# Patient Record
Sex: Female | Born: 1990 | Race: White | Hispanic: No | Marital: Single | Smoking: Never smoker
Health system: Southern US, Community
[De-identification: ages and names within clinical notes are randomized; demographics above are authoritative.]

## PROBLEM LIST (undated history)

## (undated) HISTORY — PX: WISDOM TOOTH EXTRACTION: SHX21

---
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
## 2015-06-14 ENCOUNTER — Other Ambulatory Visit: Payer: Self-pay | Admitting: Otolaryngology

## 2015-06-14 DIAGNOSIS — R221 Localized swelling, mass and lump, neck: Secondary | ICD-10-CM

## 2015-06-18 ENCOUNTER — Ambulatory Visit
Admission: RE | Admit: 2015-06-18 | Discharge: 2015-06-18 | Disposition: A | Discharge status: Home / Self Care | Source: Ambulatory Visit | Attending: Otolaryngology | Admitting: Otolaryngology

## 2015-06-18 DIAGNOSIS — R221 Localized swelling, mass and lump, neck: Secondary | ICD-10-CM | POA: Diagnosis present

## 2015-06-18 MED ORDER — IOHEXOL 300 MG/ML  SOLN
75.0000 mL | Freq: Once | INTRAMUSCULAR | Status: AC | PRN
  Administered 2015-06-18: 75 mL via INTRAVENOUS

## 2015-08-04 ENCOUNTER — Encounter: Payer: Self-pay | Admitting: *Deleted

## 2015-08-04 ENCOUNTER — Other Ambulatory Visit

## 2015-08-04 NOTE — Patient Instructions (Signed)
  Your procedure is scheduled on: 08/09/15 Report to Day Surgery. To find out your arrival time please call [phone] between 1PM - 3PM on 08/06/15.  Remember: Instructions that are not followed completely may result in serious medical risk, up to and including death, or upon the discretion of your surgeon and anesthesiologist your surgery may need to be rescheduled.    _x___ 1. Do not eat food or drink liquids after midnight. No gum chewing or hard candies.     __x__ 2. No Alcohol for 24 hours before or after surgery.   ____ 3. Bring all medications with you on the day of surgery if instructed.    __x__ 4. Notify your doctor if there is any change in your medical condition     (cold, fever, infections).     Do not wear jewelry, make-up, hairpins, clips or nail polish.  Do not wear lotions, powders, or perfumes. You may wear deodorant.  Do not shave 48 hours prior to surgery. Men may shave face and neck.  Do not bring valuables to the hospital.    [facility] is not responsible for any belongings or valuables.               Contacts, dentures or bridgework may not be worn into surgery.  Leave your suitcase in the car. After surgery it may be brought to your room.  For patients admitted to the hospital, discharge time is determined by your                treatment team.   Patients discharged the day of surgery will not be allowed to drive home.   Please read over the following fact sheets that you were given:   Surgical Site Infection Prevention   ____ Take these medicines the morning of surgery with A SIP OF WATER:    1. none  2.   3.   4.  5.  6.  ____ Fleet Enema (as directed)   ____ Use CHG Soap as directed  ____ Use inhalers on the day of surgery  ____ Stop metformin 2 days prior to surgery    ____ Take 1/2 of usual insulin dose the night before surgery and none on the morning of surgery.   ____ Stop Coumadin/Plavix/aspirin on ____ Stop Anti-inflammatories  on   ____ Stop supplements until after surgery.    ____ Bring C-Pap to the hospital.

## 2015-08-09 ENCOUNTER — Ambulatory Visit: Admitting: Anesthesiology

## 2015-08-09 ENCOUNTER — Ambulatory Visit
Admission: RE | Admit: 2015-08-09 | Discharge: 2015-08-09 | Disposition: A | Discharge status: Home / Self Care | Source: Ambulatory Visit | Attending: Otolaryngology | Admitting: Otolaryngology

## 2015-08-09 ENCOUNTER — Encounter
Admission: RE | Disposition: A | Discharge status: Home / Self Care | Payer: Self-pay | Source: Ambulatory Visit | Attending: Otolaryngology

## 2015-08-09 DIAGNOSIS — D170 Benign lipomatous neoplasm of skin and subcutaneous tissue of head, face and neck: Secondary | ICD-10-CM | POA: Diagnosis present

## 2015-08-09 DIAGNOSIS — Z7984 Long term (current) use of oral hypoglycemic drugs: Secondary | ICD-10-CM | POA: Insufficient documentation

## 2015-08-09 DIAGNOSIS — Z820 Family history of epilepsy and other diseases of the nervous system: Secondary | ICD-10-CM | POA: Diagnosis not present

## 2015-08-09 DIAGNOSIS — L709 Acne, unspecified: Secondary | ICD-10-CM | POA: Insufficient documentation

## 2015-08-09 DIAGNOSIS — L309 Dermatitis, unspecified: Secondary | ICD-10-CM | POA: Diagnosis not present

## 2015-08-09 HISTORY — PX: LIPOMA EXCISION: SHX5283

## 2015-08-09 LAB — POCT PREGNANCY, URINE: PREG TEST UR: NEGATIVE

## 2015-08-09 SURGERY — EXCISION LIPOMA
Anesthesia: General

## 2015-08-09 MED ORDER — FAMOTIDINE 20 MG PO TABS: 20.0000 mg | ORAL_TABLET | Freq: Once | ORAL | Status: DC

## 2015-08-09 MED ORDER — ONDANSETRON HCL 4 MG/2ML IJ SOLN
INTRAMUSCULAR | Status: DC | PRN
  Administered 2015-08-09: 4 mg via INTRAVENOUS

## 2015-08-09 MED ORDER — MIDAZOLAM HCL 2 MG/2ML IJ SOLN
INTRAMUSCULAR | Status: DC | PRN
  Administered 2015-08-09: 2 mg via INTRAVENOUS

## 2015-08-09 MED ORDER — HYDROCODONE-ACETAMINOPHEN 5-325 MG PO TABS
ORAL_TABLET | ORAL | Status: AC
  Filled 2015-08-09: qty 2

## 2015-08-09 MED ORDER — ONDANSETRON HCL 4 MG/2ML IJ SOLN: 4.0000 mg | Freq: Once | INTRAMUSCULAR | Status: DC | PRN

## 2015-08-09 MED ORDER — FENTANYL CITRATE (PF) 100 MCG/2ML IJ SOLN
INTRAMUSCULAR | Status: AC
  Filled 2015-08-09: qty 2

## 2015-08-09 MED ORDER — LIDOCAINE HCL (CARDIAC) 20 MG/ML IV SOLN
INTRAVENOUS | Status: DC | PRN
  Administered 2015-08-09: 30 mg via INTRAVENOUS

## 2015-08-09 MED ORDER — BACITRACIN ZINC 500 UNIT/GM EX OINT
TOPICAL_OINTMENT | CUTANEOUS | Status: AC
  Filled 2015-08-09: qty 28.35

## 2015-08-09 MED ORDER — SUCCINYLCHOLINE CHLORIDE 20 MG/ML IJ SOLN
INTRAMUSCULAR | Status: DC | PRN
  Administered 2015-08-09: 80 mg via INTRAVENOUS

## 2015-08-09 MED ORDER — GLYCOPYRROLATE 0.2 MG/ML IJ SOLN
INTRAMUSCULAR | Status: DC | PRN
  Administered 2015-08-09: 0.2 mg via INTRAVENOUS
  Administered 2015-08-09: 0.4 mg via INTRAVENOUS

## 2015-08-09 MED ORDER — FAMOTIDINE 20 MG PO TABS
ORAL_TABLET | ORAL | Status: AC
  Administered 2015-08-09: 20 mg
  Filled 2015-08-09: qty 1

## 2015-08-09 MED ORDER — PROPOFOL 10 MG/ML IV BOLUS
INTRAVENOUS | Status: DC | PRN
  Administered 2015-08-09: 40 mg via INTRAVENOUS
  Administered 2015-08-09: 140 mg via INTRAVENOUS

## 2015-08-09 MED ORDER — NEOSTIGMINE METHYLSULFATE 10 MG/10ML IV SOLN
INTRAVENOUS | Status: DC | PRN
  Administered 2015-08-09: 3 mg via INTRAVENOUS

## 2015-08-09 MED ORDER — FENTANYL CITRATE (PF) 100 MCG/2ML IJ SOLN
INTRAMUSCULAR | Status: DC | PRN
  Administered 2015-08-09 (×2): 50 ug via INTRAVENOUS

## 2015-08-09 MED ORDER — LIDOCAINE-EPINEPHRINE (PF) 1 %-1:200000 IJ SOLN
INTRAMUSCULAR | Status: DC | PRN
  Administered 2015-08-09: 5 mL

## 2015-08-09 MED ORDER — HYDROCODONE-ACETAMINOPHEN 5-325 MG PO TABS
1.0000 | ORAL_TABLET | ORAL | Status: DC | PRN
  Administered 2015-08-09 (×2): 1 via ORAL

## 2015-08-09 MED ORDER — BACITRACIN 500 UNIT/GM EX OINT
TOPICAL_OINTMENT | CUTANEOUS | Status: DC | PRN
  Administered 2015-08-09: 1 via TOPICAL

## 2015-08-09 MED ORDER — ROCURONIUM BROMIDE 100 MG/10ML IV SOLN
INTRAVENOUS | Status: DC | PRN
  Administered 2015-08-09: 30 mg via INTRAVENOUS
  Administered 2015-08-09: 10 mg via INTRAVENOUS

## 2015-08-09 MED ORDER — DEXAMETHASONE SODIUM PHOSPHATE 10 MG/ML IJ SOLN
INTRAMUSCULAR | Status: DC | PRN
  Administered 2015-08-09: 8 mg via INTRAVENOUS

## 2015-08-09 MED ORDER — LIDOCAINE-EPINEPHRINE (PF) 1 %-1:200000 IJ SOLN
INTRAMUSCULAR | Status: AC
  Filled 2015-08-09: qty 30

## 2015-08-09 MED ORDER — LACTATED RINGERS IV SOLN
INTRAVENOUS | Status: DC
  Administered 2015-08-09 (×2): via INTRAVENOUS

## 2015-08-09 MED ORDER — FENTANYL CITRATE (PF) 100 MCG/2ML IJ SOLN
25.0000 ug | INTRAMUSCULAR | Status: DC | PRN
  Administered 2015-08-09 (×4): 25 ug via INTRAVENOUS

## 2015-08-09 SURGICAL SUPPLY — 37 items
BLADE SURG 15 STRL LF DISP TIS (BLADE) ×1 IMPLANT
BLADE SURG 15 STRL SS (BLADE) ×2
CNTNR SPEC 2.5X3XGRAD LEK (MISCELLANEOUS) ×1
CONT SPEC 4OZ STER OR WHT (MISCELLANEOUS) ×2
CONTAINER SPEC 2.5X3XGRAD LEK (MISCELLANEOUS) ×1 IMPLANT
CORD BIP STRL DISP 12FT (MISCELLANEOUS) ×3 IMPLANT
DRAPE MAG INST 16X20 L/F (DRAPES) IMPLANT
DRESSING TELFA 4X3 1S ST N-ADH (GAUZE/BANDAGES/DRESSINGS) IMPLANT
DRSG TEGADERM 2-3/8X2-3/4 SM (GAUZE/BANDAGES/DRESSINGS) IMPLANT
DRSG TEGADERM 4X4.75 (GAUZE/BANDAGES/DRESSINGS) ×3 IMPLANT
ELECT CAUTERY BLADE TIP 2.5 (TIP) ×3
ELECT NEEDLE 20X.3 GREEN (MISCELLANEOUS) ×3
ELECT REM PT RETURN 9FT ADLT (ELECTROSURGICAL) ×3
ELECTRODE CAUTERY BLDE TIP 2.5 (TIP) ×1 IMPLANT
ELECTRODE NEEDLE 20X.3 GREEN (MISCELLANEOUS) ×1 IMPLANT
ELECTRODE REM PT RTRN 9FT ADLT (ELECTROSURGICAL) ×1 IMPLANT
GAUZE SPONGE 4X4 12PLY STRL (GAUZE/BANDAGES/DRESSINGS) ×3 IMPLANT
GLOVE BIO SURGEON STRL SZ7.5 (GLOVE) ×3 IMPLANT
GLOVE EXAM LX STRL 7.5 (GLOVE) ×3 IMPLANT
GOWN STRL REUS W/ TWL LRG LVL3 (GOWN DISPOSABLE) ×2 IMPLANT
GOWN STRL REUS W/TWL LRG LVL3 (GOWN DISPOSABLE) ×4
HARMONIC SCALPEL FOCUS (MISCELLANEOUS) IMPLANT
LABEL OR SOLS (LABEL) IMPLANT
NS IRRIG 500ML POUR BTL (IV SOLUTION) ×3 IMPLANT
PACK HEAD/NECK (MISCELLANEOUS) ×3 IMPLANT
PROBE MONO 100X0.75 ELECT 1.9M (MISCELLANEOUS) IMPLANT
PROBE NEUROSIGN BIPOL (MISCELLANEOUS) IMPLANT
PROBE NEUROSIGN BIPOLAR (MISCELLANEOUS)
SPONGE KITTNER 5P (MISCELLANEOUS) ×3 IMPLANT
SPONGE XRAY 4X4 16PLY STRL (MISCELLANEOUS) IMPLANT
SUT ETHILON 5-0 FS-2 18 BLK (SUTURE) ×3 IMPLANT
SUT ETHILON 6 0 9-3 1X18 BLK (SUTURE) IMPLANT
SUT SILK 2 0 (SUTURE)
SUT SILK 2-0 18XBRD TIE 12 (SUTURE) IMPLANT
SUT SILK 3 0 REEL (SUTURE) IMPLANT
SUT VIC AB 4-0 PS2 18 (SUTURE) IMPLANT
SUT VIC AB 4-0 RB1 18 (SUTURE) ×3 IMPLANT

## 2015-08-09 NOTE — Anesthesia Preprocedure Evaluation (Signed)
Anesthesia Evaluation  Patient identified by MRN, date of birth, ID band Patient awake    Reviewed: Allergy & Precautions, NPO status , Patient's Chart, lab work & pertinent test results, reviewed documented beta blocker date and time   Airway Mallampati: III  TM Distance: >3 FB     Dental  (+) Chipped   Pulmonary           Cardiovascular      Neuro/Psych    GI/Hepatic   Endo/Other    Renal/GU      Musculoskeletal   Abdominal   Peds  Hematology   Anesthesia Other Findings Obese.  Reproductive/Obstetrics                             Anesthesia Physical Anesthesia Plan  ASA: II  Anesthesia Plan: General   Post-op Pain Management:    Induction: Intravenous  Airway Management Planned: Oral ETT  Additional Equipment:   Intra-op Plan:   Post-operative Plan:   Informed Consent: I have reviewed the patients History and Physical, chart, labs and discussed the procedure including the risks, benefits and alternatives for the proposed anesthesia with the patient or authorized representative who has indicated his/her understanding and acceptance.     Plan Discussed with: CRNA  Anesthesia Plan Comments:         Anesthesia Quick Evaluation

## 2015-08-09 NOTE — H&P (Signed)
  H&P has been reviewed and no changes necessary. To be downloaded later. 

## 2015-08-09 NOTE — Discharge Instructions (Signed)

## 2015-08-09 NOTE — Op Note (Signed)
08/09/2015  10:28 AM    Sarah Burnett  [id number]   Pre-Op Dx:  Lipoma right posterior neck  Post-op Dx: Lipoma right posterior neck, 3 x 4 cm  Proc: Excision lipoma right posterior neck   Surg:  Kees Idrovo H  Anes:  GOT  EBL:  25 mL  Comp:  None  Findings:  Lipoma in the soft tissue overlying the muscular fascia  Procedure: The patient was given general anesthesia by oral endotracheal intubation in a supine position. She was then rotated prone to be able to access the neck. The neck mass was palpated and marked posteriorly and then 1% Xylocaine with epi 1:100,000 was used for infiltration, 5 mL total was placed. She was then prepped and draped in a sterile fashion.  An incision was created horizontally over a posterior neck skin crease and carried through the full thickness skin into the subcutaneous tissues. These were elevated in all directions from the neck wound. This was overlying the lipoma that was densely adherent to the underlying muscular fascia. Electrocautery was then used for up the lateral borders of the lipoma from the lateral subcutaneous tissues and then dissecting underneath it at the level of the alar fascia. The entire mass was removed and was 3 x 4 cm in about one and a half centimeters in thickness. Bleeding was controlled with electrocautery. There was minimal bleeding as it did not go through the fascia on removed some of the superficial fascia only. Wound was copiously irrigated and there was nose. The bleeding, and I did not feel a drain was necessary. The wound was closed with 40 Vicryls to close the subcutaneous layer. This was used to close the dermis as well. The skin was then closed with a 5-0 nylon running locking suture. The wound was covered with bacitracin, Telfa, and a Tegaderm dressing. A pressure dressing was then placed over the top to help make sure that all the dead space was closed.  Patient was awakened and taken to the recovery room in  satisfactory condition. There were no operative complications.  Dispo:   To PACU to be discharged home  Plan:  To follow-up in the office in 10 days for suture removal. We'll give some pain medication to use as necessary.  Ej Pinson H  08/09/2015 10:28 AM

## 2015-08-09 NOTE — Anesthesia Procedure Notes (Signed)
Procedure Name: Intubation Date/Time: 08/09/2015 9:45 AM Performed by: Allean Found Pre-anesthesia Checklist: Patient identified, Emergency Drugs available, Suction available, Patient being monitored and Timeout performed Patient Re-evaluated:Patient Re-evaluated prior to inductionOxygen Delivery Method: Circle system utilized Preoxygenation: Pre-oxygenation with 100% oxygen Intubation Type: IV induction Ventilation: Mask ventilation without difficulty Laryngoscope Size: Mac and 3 Grade View: Grade II Tube type: Oral Tube size: 7.0 mm Number of attempts: 1 Placement Confirmation: ETT inserted through vocal cords under direct vision,  positive ETCO2 and breath sounds checked- equal and bilateral Secured at: 22 cm Tube secured with: Tape

## 2015-08-09 NOTE — Anesthesia Postprocedure Evaluation (Signed)
Anesthesia Post Note  Patient: Sarah Burnett  Procedure(s) Performed: Procedure(s) (LRB): EXCISION LIPOMA / POSTERIOR NECK (N/A)  Patient location during evaluation: Other Anesthesia Type: General Level of consciousness: awake and alert Pain management: pain level controlled Vital Signs Assessment: post-procedure vital signs reviewed and stable Respiratory status: spontaneous breathing, nonlabored ventilation, respiratory function stable and patient connected to nasal cannula oxygen Cardiovascular status: blood pressure returned to baseline and stable Postop Assessment: no signs of nausea or vomiting Anesthetic complications: no    Last Vitals:  Filed Vitals:   08/09/15 1130 08/09/15 1235  BP: 117/69 118/72  Pulse: 63 65  Temp: 36.4 C   Resp: 18 18    Last Pain:  Filed Vitals:   08/09/15 1253  PainSc: 3                  Aliyah Abeyta S

## 2015-08-09 NOTE — Transfer of Care (Signed)
Immediate Anesthesia Transfer of Care Note  Patient: Sarah Burnett  Procedure(s) Performed: Procedure(s): EXCISION LIPOMA / POSTERIOR NECK (N/A)  Patient Location: PACU  Anesthesia Type:General  Level of Consciousness: sedated  Airway & Oxygen Therapy: Patient Spontanous Breathing and Patient connected to face mask oxygen  Post-op Assessment: Report given to RN and Post -op Vital signs reviewed and stable  Post vital signs: Reviewed and stable  Last Vitals:  Filed Vitals:   08/09/15 0858 08/09/15 1039  BP: 139/74 121/74  Pulse: 101 65  Temp: 36.3 C 36.3 C  Resp: 16 19    Last Pain:  Filed Vitals:   08/09/15 1040  PainSc: Asleep         Complications: No apparent anesthesia complications

## 2015-08-10 LAB — SURGICAL PATHOLOGY

## 2015-12-13 ENCOUNTER — Encounter
Admission: RE | Admit: 2015-12-13 | Discharge: 2015-12-13 | Disposition: A | Discharge status: Home / Self Care | Source: Ambulatory Visit | Attending: Otolaryngology | Admitting: Otolaryngology

## 2015-12-13 NOTE — Patient Instructions (Signed)
  Your procedure is scheduled on: 12-20-15 Report to Same Day Surgery 2nd floor medical mall To find out your arrival time please call [phone] between 1PM - 3PM on 12-17-15  Remember: Instructions that are not followed completely may result in serious medical risk, up to and including death, or upon the discretion of your surgeon and anesthesiologist your surgery may need to be rescheduled.    _x___ 1. Do not eat food or drink liquids after midnight. No gum chewing or hard candies.     __x__ 2. No Alcohol for 24 hours before or after surgery.   __x__3. No Smoking for 24 prior to surgery.   ____  4. Bring all medications with you on the day of surgery if instructed.    __x__ 5. Notify your doctor if there is any change in your medical condition     (cold, fever, infections).     Do not wear jewelry, make-up, hairpins, clips or nail polish.  Do not wear lotions, powders, or perfumes. You may wear deodorant.  Do not shave 48 hours prior to surgery. Men may shave face and neck.  Do not bring valuables to the hospital.    [facility] is not responsible for any belongings or valuables.               Contacts, dentures or bridgework may not be worn into surgery.  Leave your suitcase in the car. After surgery it may be brought to your room.  For patients admitted to the hospital, discharge time is determined by your treatment team.   Patients discharged the day of surgery will not be allowed to drive home.    Please read over the following fact sheets that you were given:   [facility] Preparing for Surgery and or MRSA Information   ____ Take these medicines the morning of surgery with A SIP OF WATER:    1. NONE  2.  3.  4.  5.  6.  ____Fleets enema or Magnesium Citrate as directed.   ____ Use CHG Soap or sage wipes as directed on instruction sheet   ____ Use inhalers on the day of surgery and bring to hospital day of surgery  ____ Stop metformin 2 days prior to  surgery    ____ Take 1/2 of usual insulin dose the night before surgery and none on the morning of surgery.   ____ Stop aspirin or coumadin, or plavix  x__ Stop Anti-inflammatories such as Advil, Aleve, Ibuprofen, Motrin, Naproxen,          Naprosyn, Goodies powders or aspirin products. Ok to take Tylenol.   ____ Stop supplements until after surgery.    ____ Bring C-Pap to the hospital.

## 2015-12-20 ENCOUNTER — Ambulatory Visit: Admitting: Anesthesiology

## 2015-12-20 ENCOUNTER — Encounter: Payer: Self-pay | Admitting: *Deleted

## 2015-12-20 ENCOUNTER — Ambulatory Visit
Admission: RE | Admit: 2015-12-20 | Discharge: 2015-12-20 | Disposition: A | Discharge status: Home / Self Care | Source: Ambulatory Visit | Attending: Otolaryngology | Admitting: Otolaryngology

## 2015-12-20 ENCOUNTER — Encounter
Admission: RE | Disposition: A | Discharge status: Home / Self Care | Payer: Self-pay | Source: Ambulatory Visit | Attending: Otolaryngology

## 2015-12-20 DIAGNOSIS — E669 Obesity, unspecified: Secondary | ICD-10-CM | POA: Insufficient documentation

## 2015-12-20 DIAGNOSIS — Z6841 Body Mass Index (BMI) 40.0 and over, adult: Secondary | ICD-10-CM | POA: Insufficient documentation

## 2015-12-20 DIAGNOSIS — D170 Benign lipomatous neoplasm of skin and subcutaneous tissue of head, face and neck: Secondary | ICD-10-CM | POA: Insufficient documentation

## 2015-12-20 HISTORY — PX: EXCISION MASS NECK: SHX6703

## 2015-12-20 LAB — POCT PREGNANCY, URINE: PREG TEST UR: NEGATIVE

## 2015-12-20 SURGERY — EXCISION, MASS, NECK
Anesthesia: General

## 2015-12-20 MED ORDER — BACITRACIN 500 UNIT/GM EX OINT
TOPICAL_OINTMENT | CUTANEOUS | Status: DC | PRN
  Administered 2015-12-20: 1 via TOPICAL

## 2015-12-20 MED ORDER — LIDOCAINE-EPINEPHRINE 0.5 %-1:200000 IJ SOLN
INTRAMUSCULAR | Status: AC
  Filled 2015-12-20: qty 1

## 2015-12-20 MED ORDER — LIDOCAINE HCL (CARDIAC) 20 MG/ML IV SOLN
INTRAVENOUS | Status: DC | PRN
  Administered 2015-12-20: 100 mg via INTRAVENOUS

## 2015-12-20 MED ORDER — PROPOFOL 10 MG/ML IV BOLUS
INTRAVENOUS | Status: DC | PRN
  Administered 2015-12-20: 200 mg via INTRAVENOUS

## 2015-12-20 MED ORDER — FENTANYL CITRATE (PF) 100 MCG/2ML IJ SOLN
INTRAMUSCULAR | Status: DC
Start: 2015-12-20 — End: 2015-12-20
  Filled 2015-12-20: qty 2

## 2015-12-20 MED ORDER — KETOROLAC TROMETHAMINE 30 MG/ML IJ SOLN
INTRAMUSCULAR | Status: DC | PRN
  Administered 2015-12-20: 30 mg via INTRAVENOUS

## 2015-12-20 MED ORDER — DEXAMETHASONE SODIUM PHOSPHATE 4 MG/ML IJ SOLN
INTRAMUSCULAR | Status: DC | PRN
  Administered 2015-12-20: 5 mg via INTRAVENOUS

## 2015-12-20 MED ORDER — ONDANSETRON HCL 4 MG/2ML IJ SOLN
INTRAMUSCULAR | Status: DC | PRN
  Administered 2015-12-20: 4 mg via INTRAVENOUS

## 2015-12-20 MED ORDER — LACTATED RINGERS IV SOLN
INTRAVENOUS | Status: DC
  Administered 2015-12-20: 09:00:00 via INTRAVENOUS

## 2015-12-20 MED ORDER — FENTANYL CITRATE (PF) 100 MCG/2ML IJ SOLN
INTRAMUSCULAR | Status: DC | PRN
  Administered 2015-12-20: 200 ug via INTRAVENOUS

## 2015-12-20 MED ORDER — FAMOTIDINE 20 MG PO TABS
20.0000 mg | ORAL_TABLET | Freq: Once | ORAL | Status: AC
  Administered 2015-12-20: 20 mg via ORAL

## 2015-12-20 MED ORDER — FAMOTIDINE 20 MG PO TABS
ORAL_TABLET | ORAL | Status: AC
  Administered 2015-12-20: 20 mg via ORAL
  Filled 2015-12-20: qty 1

## 2015-12-20 MED ORDER — LIDOCAINE-EPINEPHRINE 1 %-1:100000 IJ SOLN
INTRAMUSCULAR | Status: AC
  Filled 2015-12-20: qty 1

## 2015-12-20 MED ORDER — ROCURONIUM BROMIDE 100 MG/10ML IV SOLN
INTRAVENOUS | Status: DC | PRN
  Administered 2015-12-20: 40 mg via INTRAVENOUS

## 2015-12-20 MED ORDER — MIDAZOLAM HCL 2 MG/2ML IJ SOLN
INTRAMUSCULAR | Status: DC | PRN
  Administered 2015-12-20: 2 mg via INTRAVENOUS

## 2015-12-20 MED ORDER — ONDANSETRON HCL 4 MG/2ML IJ SOLN: 4.0000 mg | Freq: Once | INTRAMUSCULAR | Status: DC | PRN

## 2015-12-20 MED ORDER — PHENYLEPHRINE HCL 10 MG/ML IJ SOLN
INTRAMUSCULAR | Status: DC | PRN
  Administered 2015-12-20 (×3): 100 ug via INTRAVENOUS
  Administered 2015-12-20 (×2): 200 ug via INTRAVENOUS
  Administered 2015-12-20: 100 ug via INTRAVENOUS

## 2015-12-20 MED ORDER — FENTANYL CITRATE (PF) 100 MCG/2ML IJ SOLN
25.0000 ug | INTRAMUSCULAR | Status: DC | PRN
  Administered 2015-12-20 (×3): 25 ug via INTRAVENOUS

## 2015-12-20 MED ORDER — LIDOCAINE-EPINEPHRINE 1 %-1:100000 IJ SOLN
INTRAMUSCULAR | Status: DC | PRN
  Administered 2015-12-20: 3 mL

## 2015-12-20 MED ORDER — BACITRACIN ZINC 500 UNIT/GM EX OINT
TOPICAL_OINTMENT | CUTANEOUS | Status: AC
  Filled 2015-12-20: qty 28.35

## 2015-12-20 SURGICAL SUPPLY — 39 items
BLADE SURG 15 STRL LF DISP TIS (BLADE) ×1 IMPLANT
BLADE SURG 15 STRL SS (BLADE) ×2
CNTNR SPEC 2.5X3XGRAD LEK (MISCELLANEOUS)
CONT SPEC 4OZ STER OR WHT (MISCELLANEOUS)
CONTAINER SPEC 2.5X3XGRAD LEK (MISCELLANEOUS) IMPLANT
CORD BIP STRL DISP 12FT (MISCELLANEOUS) ×3 IMPLANT
DRAIN TLS ROUND 10FR (DRAIN) ×3 IMPLANT
DRAPE MAG INST 16X20 L/F (DRAPES) ×3 IMPLANT
DRESSING TELFA 4X3 1S ST N-ADH (GAUZE/BANDAGES/DRESSINGS) ×3 IMPLANT
DRSG TEGADERM 2-3/8X2-3/4 SM (GAUZE/BANDAGES/DRESSINGS) ×3 IMPLANT
DRSG TEGADERM 4X4.75 (GAUZE/BANDAGES/DRESSINGS) ×3 IMPLANT
ELECT CAUTERY BLADE TIP 2.5 (TIP) ×3
ELECT NEEDLE 20X.3 GREEN (MISCELLANEOUS)
ELECT REM PT RETURN 9FT ADLT (ELECTROSURGICAL) ×3
ELECTRODE CAUTERY BLDE TIP 2.5 (TIP) ×1 IMPLANT
ELECTRODE NEEDLE 20X.3 GREEN (MISCELLANEOUS) IMPLANT
ELECTRODE REM PT RTRN 9FT ADLT (ELECTROSURGICAL) ×1 IMPLANT
GAUZE SPONGE NON-WVN 2X2 STRL (MISCELLANEOUS) ×1 IMPLANT
GLOVE BIO SURGEON STRL SZ7.5 (GLOVE) ×3 IMPLANT
GLOVE PROTEXIS LATEX SZ 7.5 (GLOVE) ×3 IMPLANT
GOWN STRL REUS W/ TWL LRG LVL3 (GOWN DISPOSABLE) ×2 IMPLANT
GOWN STRL REUS W/TWL LRG LVL3 (GOWN DISPOSABLE) ×4
HARMONIC SCALPEL FOCUS (MISCELLANEOUS) IMPLANT
LABEL OR SOLS (LABEL) IMPLANT
NS IRRIG 500ML POUR BTL (IV SOLUTION) ×3 IMPLANT
PACK HEAD/NECK (MISCELLANEOUS) ×3 IMPLANT
PROBE MONO 100X0.75 ELECT 1.9M (MISCELLANEOUS) IMPLANT
PROBE NEUROSIGN BIPOL (MISCELLANEOUS) IMPLANT
PROBE NEUROSIGN BIPOLAR (MISCELLANEOUS)
SPONGE KITTNER 5P (MISCELLANEOUS) ×3 IMPLANT
SPONGE VERSALON 2X2 STRL (MISCELLANEOUS) ×2
SPONGE XRAY 4X4 16PLY STRL (MISCELLANEOUS) ×3 IMPLANT
SUT ETHILON 5-0 FS-2 18 BLK (SUTURE) ×3 IMPLANT
SUT ETHILON 6 0 9-3 1X18 BLK (SUTURE) IMPLANT
SUT SILK 2 0 (SUTURE) ×2
SUT SILK 2-0 18XBRD TIE 12 (SUTURE) ×1 IMPLANT
SUT SILK 3 0 REEL (SUTURE) IMPLANT
SUT VIC AB 4-0 PS2 18 (SUTURE) IMPLANT
SUT VIC AB 4-0 RB1 18 (SUTURE) IMPLANT

## 2015-12-20 NOTE — Op Note (Signed)
12/20/2015  11:35 AM    Sarah Burnett  [id number]   Pre-Op Dx:  Lipoma right posterior neck  Post-op Dx: Lipoma right posterior neck  Proc: Excision of lipoma right posterior neck   Surg:  Ellianah Cordy H  Anes:  GOT  EBL:  30 mL  Comp:  None  Findings:  Thick lipoma in the posterior neck fat superficial to the neck muscles.  Procedure: The patient was given general anesthesia by oral endotracheal intubation. She was rolled over to her left side to be able to visualize the right posterior neck. At previously marked the area of the lipoma. She has a scar 4 cm above this where a previous larger lipoma had been removed earlier this year. When she was positioned correctly the area was injected with 3 mL of 1% Xylocaine with epi 1-100,000. She was prepped and draped in a sterile fashion.  A horizontal incision was created over the lipoma with elevation of skin and subcutaneous tissue over the entire lipoma in all directions around the incision line. He did feel the lipomatous tumor right in the middle of this that was sticking out further. No specific capsule visible around it. A layer of fatty tissue was then incised superiorly and anteriorly to around the deep edge of this down to the fascia of the neck muscles. Section was then carried underneath it along the fascia to remove the entire lipoma. This had fatty tissue around it on all sides. Bleeding was controlled with electrocautery. The wound was copiously irrigated. It was palpated and there was no evidence of any further masses anywhere. A 10 French TLS drain was then placed through a separate stab incision to help eliminate the dead space. The subcutaneous tissues were then closed with 40 Vicryls. The skin edges were held in apposition with a running locking 5-0 nylon suture. The drain was placed to low continuous Vacutainer suction and there were no air leaks. Wound was covered with bacitracin, Telfa, and a couple of 2 x 2's before a  Tegaderm dressing was placed. The Vacutainer was taped to her shoulder.  The patient was awakened taken to the recovery room in satisfactory condition. There were no operative complications.  Dispo:   To PACU to be discharged home  Plan:  To follow-up in the office tomorrow for removal of her drain and received dressing the wound, and then see her again in a week for suture removal. She'll use Norco for pain as necessary and will put her on antibiotic for prevention.  Greogry Goodwyn H  12/20/2015 11:35 AM

## 2015-12-20 NOTE — Anesthesia Preprocedure Evaluation (Addendum)
Anesthesia Evaluation  Patient identified by MRN, date of birth, ID band Patient awake    Reviewed: Allergy & Precautions, NPO status , Patient's Chart, lab work & pertinent test results  Airway Mallampati: II  TM Distance: >3 FB     Dental no notable dental hx.    Pulmonary neg pulmonary ROS,    Pulmonary exam normal        Cardiovascular negative cardio ROS Normal cardiovascular exam     Neuro/Psych negative neurological ROS  negative psych ROS   GI/Hepatic negative GI ROS, Neg liver ROS,   Endo/Other  negative endocrine ROS  Renal/GU negative Renal ROS  negative genitourinary   Musculoskeletal negative musculoskeletal ROS (+)   Abdominal Normal abdominal exam  (+)   Peds negative pediatric ROS (+)  Hematology negative hematology ROS (+)   Anesthesia Other Findings obese  Reproductive/Obstetrics                             Anesthesia Physical Anesthesia Plan  ASA: II  Anesthesia Plan: General   Post-op Pain Management:    Induction: Intravenous  Airway Management Planned: Oral ETT  Additional Equipment:   Intra-op Plan:   Post-operative Plan: Extubation in OR  Informed Consent: I have reviewed the patients History and Physical, chart, labs and discussed the procedure including the risks, benefits and alternatives for the proposed anesthesia with the patient or authorized representative who has indicated his/her understanding and acceptance.   Dental advisory given  Plan Discussed with: CRNA and Surgeon  Anesthesia Plan Comments:         Anesthesia Quick Evaluation

## 2015-12-20 NOTE — Discharge Instructions (Signed)

## 2015-12-20 NOTE — H&P (Signed)
  H&P has been reviewed and no changes necessary. To be downloaded later. 

## 2015-12-20 NOTE — Anesthesia Postprocedure Evaluation (Signed)
Anesthesia Post Note  Patient: Sarah Burnett  Procedure(s) Performed: Procedure(s) (LRB): EXCISION POSTERIOR NECK MASS (N/A)  Patient location during evaluation: PACU Anesthesia Type: General Level of consciousness: awake and alert Pain management: pain level controlled Vital Signs Assessment: post-procedure vital signs reviewed and stable Respiratory status: spontaneous breathing, nonlabored ventilation, respiratory function stable and patient connected to nasal cannula oxygen Cardiovascular status: blood pressure returned to baseline and stable Postop Assessment: no signs of nausea or vomiting Anesthetic complications: no    Last Vitals:  Vitals:   12/20/15 1228 12/20/15 1245  BP: 117/64 122/64  Pulse: 97 72  Resp: 20 18  Temp: 36.3 C     Last Pain:  Vitals:   12/20/15 1245  PainSc: 2                  Martha Clan

## 2015-12-20 NOTE — Anesthesia Procedure Notes (Signed)
Procedure Name: Intubation Date/Time: 12/20/2015 10:29 AM Performed by: Rosaria Ferries, Eleftherios Dudenhoeffer Pre-anesthesia Checklist: Patient identified, Emergency Drugs available, Suction available and Patient being monitored Patient Re-evaluated:Patient Re-evaluated prior to inductionOxygen Delivery Method: Circle system utilized Preoxygenation: Pre-oxygenation with 100% oxygen Intubation Type: IV induction Laryngoscope Size: Mac and 3 Grade View: Grade I Tube size: 7.0 mm Number of attempts: 1 Placement Confirmation: ETT inserted through vocal cords under direct vision,  positive ETCO2 and breath sounds checked- equal and bilateral Secured at: 22 cm Tube secured with: Tape Dental Injury: Teeth and Oropharynx as per pre-operative assessment

## 2015-12-20 NOTE — Transfer of Care (Signed)
Immediate Anesthesia Transfer of Care Note  Patient: Sarah Burnett  Procedure(s) Performed: Procedure(s): EXCISION POSTERIOR NECK MASS (N/A)  Patient Location: PACU  Anesthesia Type:General  Level of Consciousness: awake, alert , oriented and patient cooperative  Airway & Oxygen Therapy: Patient Spontanous Breathing and Patient connected to nasal cannula oxygen  Post-op Assessment: Report given to RN and Post -op Vital signs reviewed and stable  Post vital signs: Reviewed and stable  Last Vitals:  Vitals:   12/20/15 0818 12/20/15 1135  BP: (!) 141/82 (!) 131/91  Pulse: 85 93  Resp: 20 20  Temp: 36.7 C 36.3 C    Last Pain:  Vitals:   12/20/15 1135  PainSc: 3          Complications: No apparent anesthesia complications

## 2015-12-21 LAB — SURGICAL PATHOLOGY

## 2017-12-03 ENCOUNTER — Other Ambulatory Visit: Payer: Self-pay | Admitting: Neurology

## 2017-12-03 DIAGNOSIS — G44221 Chronic tension-type headache, intractable: Secondary | ICD-10-CM

## 2017-12-03 DIAGNOSIS — G43119 Migraine with aura, intractable, without status migrainosus: Secondary | ICD-10-CM

## 2017-12-18 ENCOUNTER — Ambulatory Visit
Admission: RE | Admit: 2017-12-18 | Discharge: 2017-12-18 | Disposition: A | Discharge status: Home / Self Care | Source: Ambulatory Visit | Attending: Neurology | Admitting: Neurology

## 2017-12-18 DIAGNOSIS — G43119 Migraine with aura, intractable, without status migrainosus: Secondary | ICD-10-CM | POA: Diagnosis present

## 2017-12-18 DIAGNOSIS — G44221 Chronic tension-type headache, intractable: Secondary | ICD-10-CM | POA: Insufficient documentation

## 2017-12-18 MED ORDER — GADOBUTROL 1 MMOL/ML IV SOLN
10.0000 mL | Freq: Once | INTRAVENOUS | Status: AC | PRN
  Administered 2017-12-18: 10 mL via INTRAVENOUS

## 2018-05-08 ENCOUNTER — Ambulatory Visit: Attending: Neurology

## 2018-05-08 DIAGNOSIS — G4733 Obstructive sleep apnea (adult) (pediatric): Secondary | ICD-10-CM | POA: Insufficient documentation

## 2019-10-09 IMAGING — MR MR HEAD WO/W CM
11 series · 48 of 48 positions shown · IV contrast (gadavist)
Comparison: Neck CT 06/18/2015

CLINICAL DATA: Chronic tension type headache. Intractable migraine
with Khalilov without status

EXAM:
MRI HEAD WITHOUT AND WITH CONTRAST
TECHNIQUE: Multiplanar, multiecho pulse sequences of the brain and surrounding
structures were obtained without and with intravenous contrast.
CONTRAST:  10 cc Gadavist intravenous

[Series 3: DWI · axial · 3.0mm · 1.20mm/px · z∈[-84,+78]mm · 4 of 55 slices shown (1 of 2)]
[im 1/55]
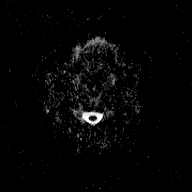
[im 19/55]
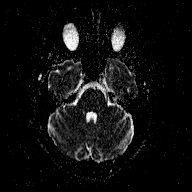
[im 37/55]
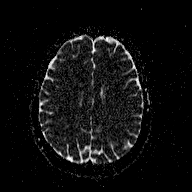
[im 55/55]
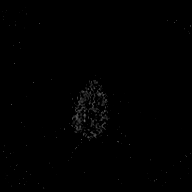

[Series 4: T1 · sagittal · 5.0mm · 0.45mm/px · 2 of 23 slices shown (1 of 2)]
[im 1/23]
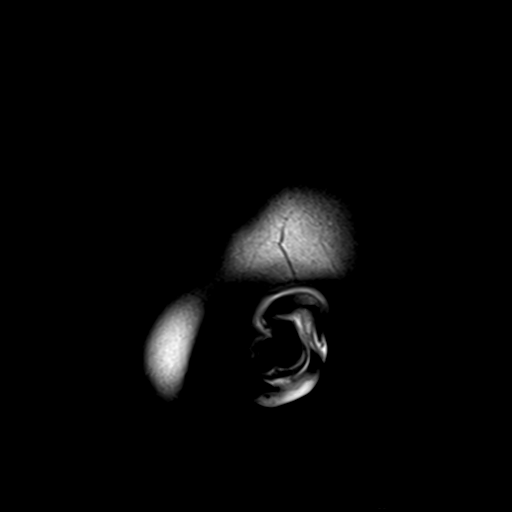
[im 23/23]
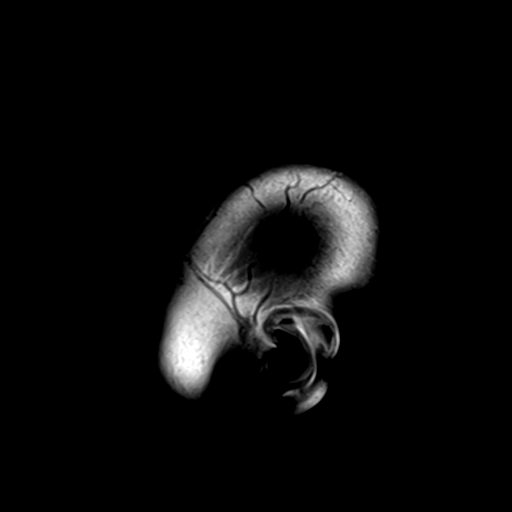

[Series 5: T2 · axial · 5.0mm · 0.72mm/px · z∈[-83,+78]mm · 2 of 24 slices shown (1 of 2)]
[im 1/24]
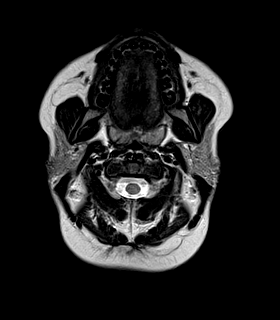
[im 24/24]
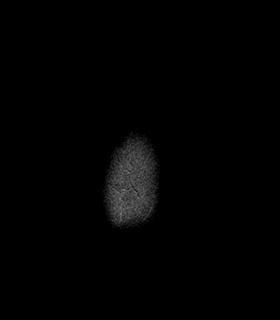

[Series 6: FLAIR · axial · 3.0mm · 0.45mm/px · z∈[-84,+78]mm · 4 of 55 slices shown (1 of 2)]
[im 1/55]
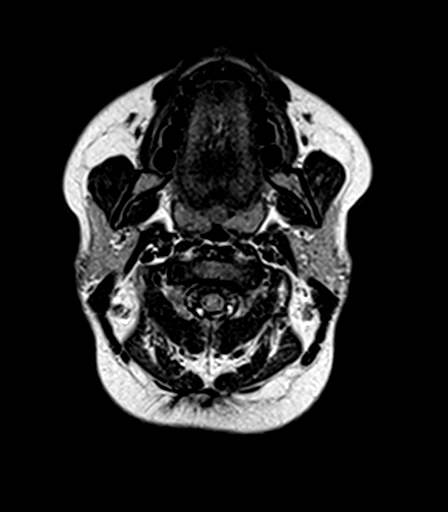
[im 19/55]
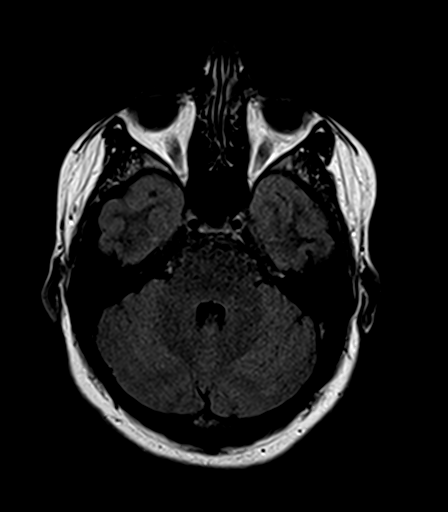
[im 37/55]
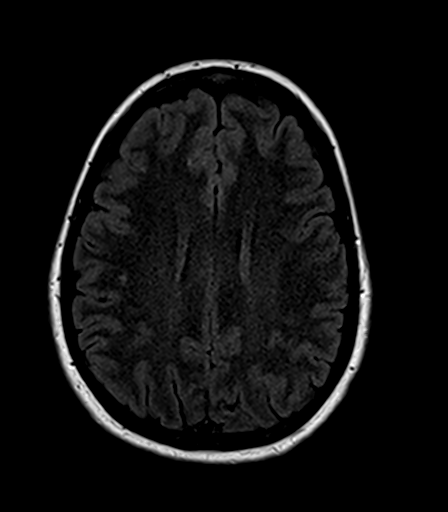
[im 55/55]
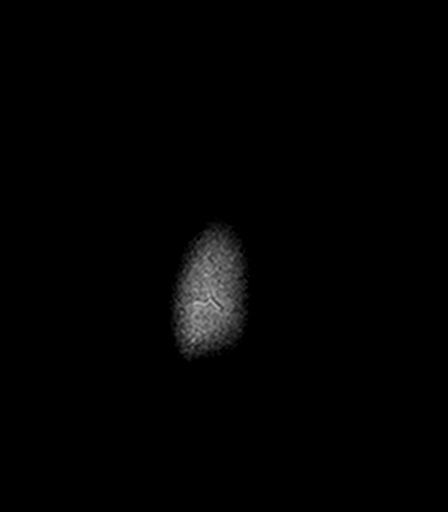

[Series 7: T2 · axial · 5.0mm · 0.72mm/px · z∈[-83,+78]mm · 2 of 24 slices shown (2 of 2)]
[im 1/24]
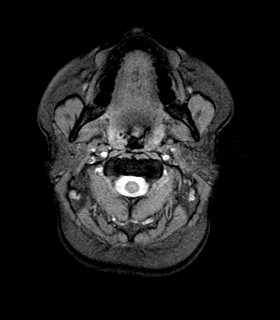
[im 24/24]
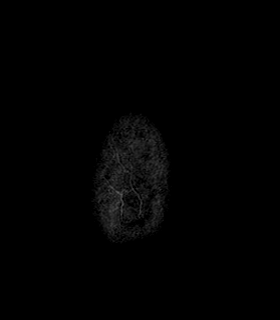

[Series 8: T1 · axial · 1.0mm · 1.00mm/px · z∈[-82,+77]mm · 12 of 160 slices shown (2 of 2)]
[im 1/160]
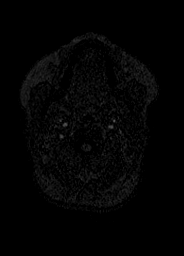
[im 15/160]
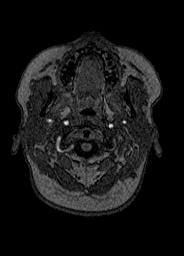
[im 29/160]
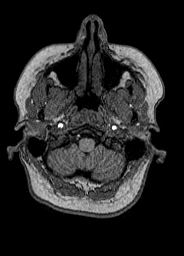
[im 44/160]
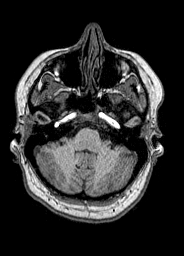
[im 58/160]
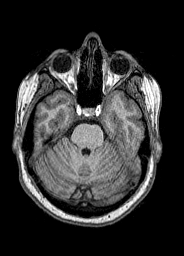
[im 73/160]
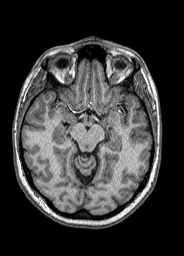
[im 87/160]
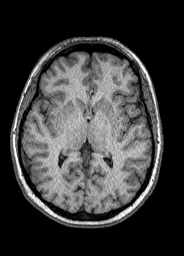
[im 102/160]
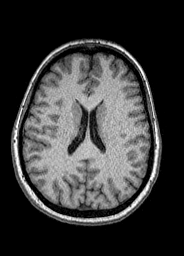
[im 116/160]
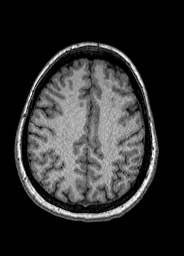
[im 131/160]
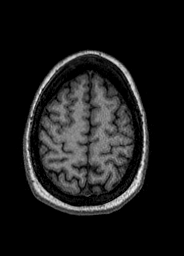
[im 145/160]
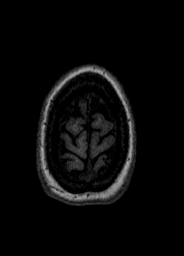
[im 160/160]
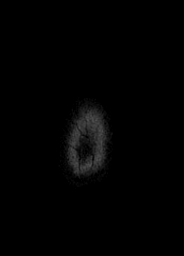

[Series 9: FLAIR · sagittal · 5.0mm · 0.45mm/px · 2 of 23 slices shown (2 of 2)]
[im 1/23]
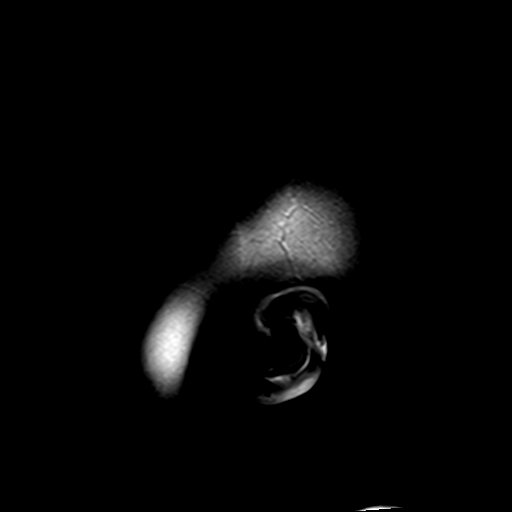
[im 23/23]
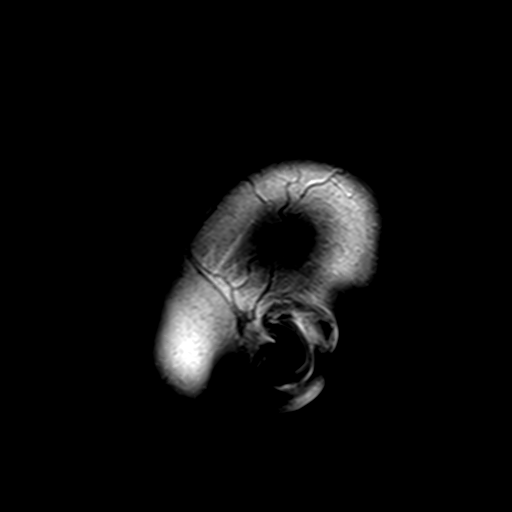

[Series 10: T2 post-contrast · coronal · 5.0mm · 0.43mm/px · 2 of 29 slices shown]
[im 1/29]
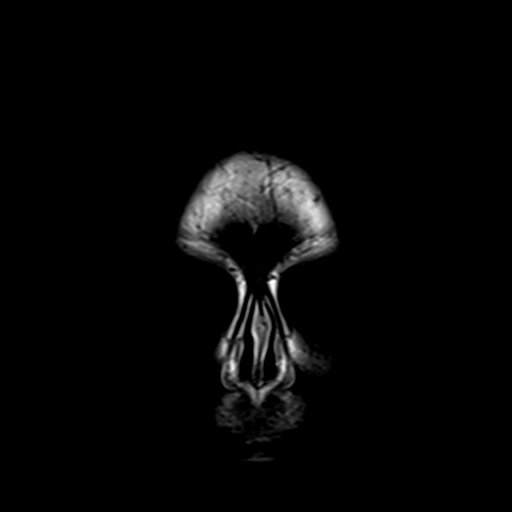
[im 29/29]
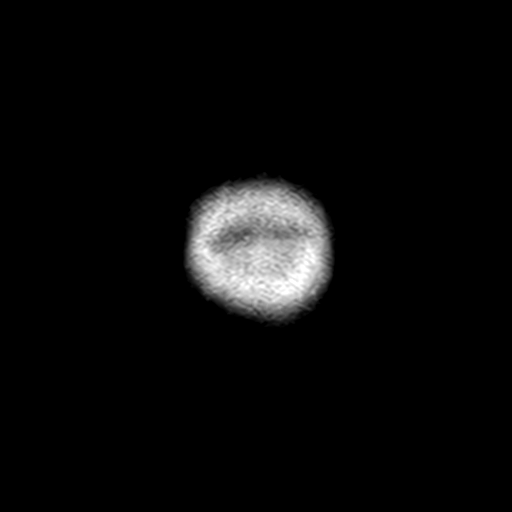

[Series 11: T1 post-contrast · axial · 1.0mm · 1.00mm/px · z∈[-82,+77]mm · 12 of 160 slices shown (1 of 2)]
[im 1/160]
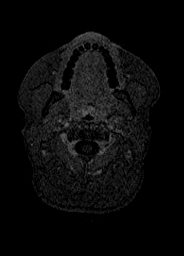
[im 15/160]
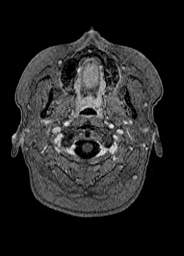
[im 29/160]
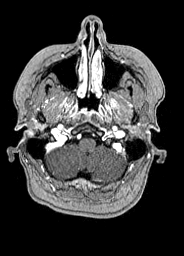
[im 44/160]
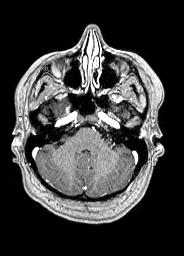
[im 58/160]
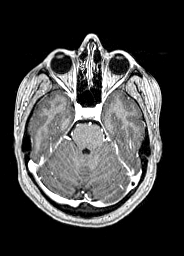
[im 73/160]
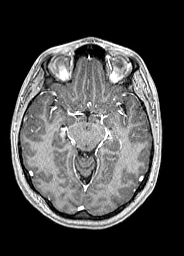
[im 87/160]
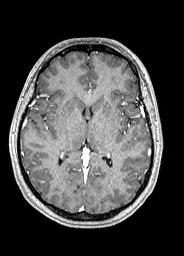
[im 102/160]
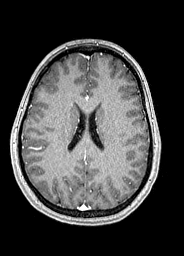
[im 116/160]
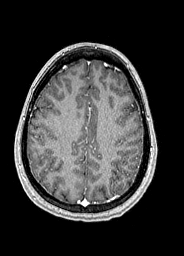
[im 131/160]
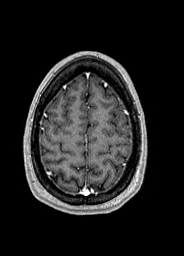
[im 145/160]
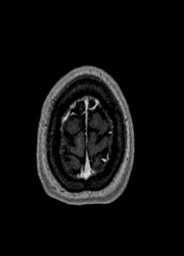
[im 160/160]
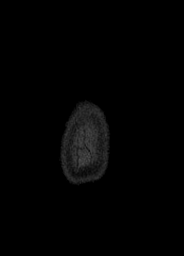

[Series 12: T1 post-contrast · coronal · 5.0mm · 0.43mm/px · 2 of 29 slices shown (2 of 2)]
[im 1/29]
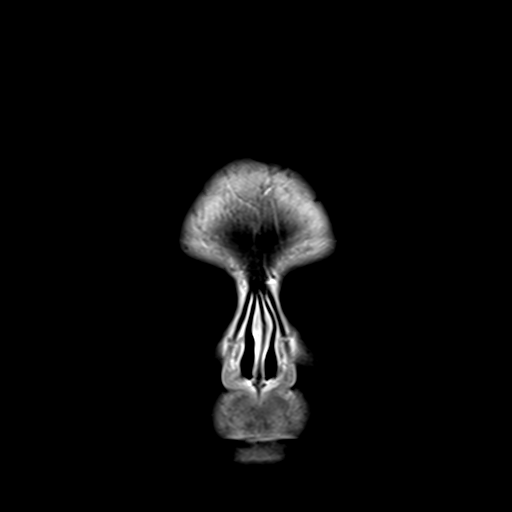
[im 29/29]
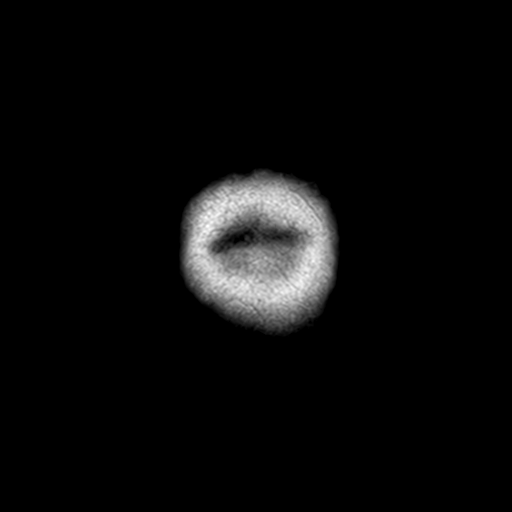

[Series 100: DWI · axial · 3.0mm · 1.20mm/px · z∈[-84,+78]mm · 4 of 55 slices shown (2 of 2)]
[im 1/55]
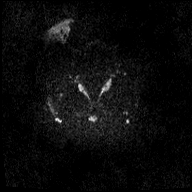
[im 19/55]
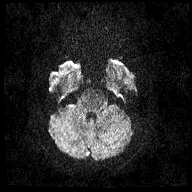
[im 37/55]
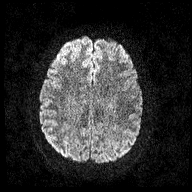
[im 55/55]
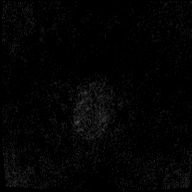

[48 of 48 positions shown; findings below may reference images not displayed]

FINDINGS: Brain: No infarction, hemorrhage, hydrocephalus, extra-axial
collection or mass lesion. Single tiny remote insult in the right
cerebral white matter. No brain atrophy.

Vascular: Major flow voids are preserved

Skull and upper cervical spine: No evidence of marrow lesion

Sinuses/Orbits: Negative
IMPRESSION: Negative brain MRI.  No explanation for headache.
